# Patient Record
Sex: Male | Born: 1985
Health system: Southern US, Community
[De-identification: ages and names within clinical notes are randomized; demographics above are authoritative.]

---
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
## 2008-12-10 ENCOUNTER — Ambulatory Visit (HOSPITAL_COMMUNITY): Admission: EM | Admit: 2008-12-10 | Discharge: 2008-12-11 | Payer: Self-pay | Admitting: Emergency Medicine
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
## 2010-11-28 LAB — BASIC METABOLIC PANEL
CO2: 22 mEq/L (ref 19–32)
Calcium: 8.7 mg/dL (ref 8.4–10.5)
Chloride: 103 mEq/L (ref 96–112)
Creatinine, Ser: 1.24 mg/dL (ref 0.4–1.5)
GFR calc Af Amer: >60 mL/min (ref >60)
Glucose, Bld: 121 mg/dL — ABNORMAL HIGH (ref 70–99)
Sodium: 136 mEq/L (ref 135–145)

## 2010-11-28 LAB — CBC
Hemoglobin: 14.9 g/dL (ref 13.0–17.0)
MCHC: 34.7 g/dL (ref 30.0–36.0)
MCV: 89.7 fL (ref 78.0–100.0)
RBC: 4.8 MIL/uL (ref 4.22–5.81)
RDW: 13.1 % (ref 11.5–15.5)

## 2010-11-28 LAB — URINALYSIS, ROUTINE W REFLEX MICROSCOPIC
Bilirubin Urine: NEGATIVE
Glucose, UA: NEGATIVE mg/dL
Hgb urine dipstick: NEGATIVE
Protein, ur: NEGATIVE mg/dL
Urobilinogen, UA: 1 mg/dL (ref 0.0–1.0)

## 2010-11-28 LAB — DIFFERENTIAL
Basophils Absolute: 0 10*3/uL (ref 0.0–0.1)
Basophils Relative: 0 % (ref 0–1)
Eosinophils Absolute: 0.3 10*3/uL (ref 0.0–0.7)
Monocytes Absolute: 0.9 10*3/uL (ref 0.1–1.0)
Monocytes Relative: 8 % (ref 3–12)
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
## 2013-08-21 ENCOUNTER — Emergency Department (HOSPITAL_COMMUNITY)
Admission: EM | Admit: 2013-08-21 | Discharge: 2013-08-21 | Disposition: A | Discharge status: Home / Self Care | Payer: Self-pay | Attending: Emergency Medicine | Admitting: Emergency Medicine

## 2013-08-21 ENCOUNTER — Encounter (HOSPITAL_COMMUNITY): Payer: Self-pay | Admitting: Emergency Medicine

## 2013-08-21 DIAGNOSIS — K029 Dental caries, unspecified: Secondary | ICD-10-CM

## 2013-08-21 DIAGNOSIS — F17200 Nicotine dependence, unspecified, uncomplicated: Secondary | ICD-10-CM | POA: Insufficient documentation

## 2013-08-21 DIAGNOSIS — H9209 Otalgia, unspecified ear: Secondary | ICD-10-CM | POA: Insufficient documentation

## 2013-08-21 DIAGNOSIS — R51 Headache: Secondary | ICD-10-CM | POA: Insufficient documentation

## 2013-08-21 MED ORDER — PENICILLIN V POTASSIUM 500 MG PO TABS: 500.0000 mg | ORAL_TABLET | Freq: Four times a day (QID) | ORAL | Status: DC

## 2013-08-21 MED ORDER — TRAMADOL HCL 50 MG PO TABS: 50.0000 mg | ORAL_TABLET | Freq: Four times a day (QID) | ORAL | Status: DC | PRN

## 2013-08-21 NOTE — Discharge Instructions (Signed)
Dental Caries Dental caries is tooth decay. This decay can cause a hole in teeth (cavity) that can get bigger and deeper over time. HOME CARE  Brush and floss your teeth. Do this at least two times a day.  Use a fluoride toothpaste.  Use a mouth rinse if told by your dentist or doctor.  Eat less sugary and starchy foods. Drink less sugary drinks.  Avoid snacking often on sugary and starchy foods. Avoid sipping often on sugary drinks.  Keep regular checkups and cleanings with your dentist.  Use fluoride supplements if told by your dentist or doctor.  Allow fluoride to be applied to teeth if told by your dentist or doctor. MAKE SURE YOU:  Understand these instructions.  Will watch your condition.  Will get help right away if you are not doing well or get worse. Document Released: 05/14/2008 Document Revised: 04/07/2013 Document Reviewed: 08/07/2012 [facility] Patient Information 2014 [place].  Dental Pain Toothache is pain in or around a tooth. It may get worse with chewing or with cold or heat.  HOME CARE  Your dentist may use a numbing medicine during treatment. If so, you may need to avoid eating until the medicine wears off. Ask your dentist about this.  Only take medicine as told by your dentist or doctor.  Avoid chewing food near the painful tooth until after all treatment is done. Ask your dentist about this. GET HELP RIGHT AWAY IF:   The problem gets worse or new problems appear.  You have a fever.  There is redness and puffiness (swelling) of the face, jaw, or neck.  You cannot open your mouth.  There is pain in the jaw.  There is very bad pain that is not helped by medicine. MAKE SURE YOU:   Understand these instructions.  Will watch your condition.  Will get help right away if you are not doing well or get worse. Document Released: 01/22/2008 Document Revised: 10/28/2011 Document Reviewed: 01/22/2008 [facility] Patient Information 2014  [place], [place].

## 2013-08-21 NOTE — ED Provider Notes (Signed)
CSN: [id number]     Arrival date & time 08/21/13  1028 History  This chart was scribed for non-physician practitioner, Junius Finner, PA-C working with Dagmar Hait, MD by Greggory Stallion, ED scribe. This patient was seen in room TR06C/TR06C and the patient's care was started at 10:58 AM.   Chief Complaint  Patient presents with  . Dental Pain   The history is provided by the patient. No language interpreter was used.   HPI Comments: Samuel Marshall is a 28 y.o. male who presents to the Emergency Department complaining of gradual onset, constant, throbbing right lower dental pain that started about 2 weeks ago. Rates the pain 10/10. Pain radiates into his right ear and causes a headache. Pt has a dental appointment within the next few weeks. He has taken tylenol and ibuprofen with no relief. Denies fever, nausea, emesis, trouble breathing, trouble swallowing.  History reviewed. No pertinent past medical history. History reviewed. No pertinent past surgical history. History reviewed. No pertinent family history. History  Substance Use Topics  . Smoking status: Current Every Day Smoker  . Smokeless tobacco: Not on file  . Alcohol Use: Yes    Review of Systems  Constitutional: Negative for fever.  HENT: Positive for dental problem and ear pain. Negative for trouble swallowing.   Gastrointestinal: Negative for nausea and vomiting.  Neurological: Positive for headaches.  All other systems reviewed and are negative.    Allergies  Review of patient's allergies indicates no known allergies.  Home Medications   Current Outpatient Rx  Name  Route  Sig  Dispense  Refill  . acetaminophen (TYLENOL) 325 MG tablet   Oral   Take 650 mg by mouth every 6 (six) hours as needed for mild pain, moderate pain, fever or headache.         Marland Kitchen aspirin-acetaminophen-caffeine (EXCEDRIN MIGRAINE) 250-250-65 MG per tablet   Oral   Take 1-2 tablets by mouth every 6 (six) hours as needed for  headache.         . ibuprofen (ADVIL,MOTRIN) 200 MG tablet   Oral   Take 400 mg by mouth every 6 (six) hours as needed for fever, headache or mild pain.         . naproxen sodium (ANAPROX) 220 MG tablet   Oral   Take 220 mg by mouth 2 (two) times daily as needed (pain).         Marland Kitchen penicillin v potassium (VEETID) 500 MG tablet   Oral   Take 1 tablet (500 mg total) by mouth 4 (four) times daily.   40 tablet   0   . traMADol (ULTRAM) 50 MG tablet   Oral   Take 1 tablet (50 mg total) by mouth every 6 (six) hours as needed.   15 tablet   0     BP 144/97  Pulse 87  Temp(Src) 98.3 F (36.8 C)  Resp 18  Ht 6\' 3"  (1.905 m)  Wt 196 lb (88.905 kg)  BMI 24.50 kg/m2  SpO2 98%  Physical Exam  Nursing note and vitals reviewed. Constitutional: He appears well-developed and well-nourished.  HENT:  Head: Normocephalic and atraumatic.  Right Ear: Hearing, tympanic membrane, external ear and ear canal normal.  Left Ear: Hearing, tympanic membrane, external ear and ear canal normal.  Nose: Nose normal.  Mouth/Throat: Uvula is midline, oropharynx is clear and moist and mucous membranes are normal.    Severe dental decay in 12 year molars on left and right lower jaw.  Scant yellow discharge. No edema.  Oropharynx clear, no erythema, edema, or exudates.   Eyes: Conjunctivae are normal. No scleral icterus.  Neck: Normal range of motion.  Cardiovascular: Normal rate.   Pulmonary/Chest: Effort normal. No respiratory distress.  Musculoskeletal: Normal range of motion.  Neurological: He is alert.  Skin: Skin is warm and dry.    ED Course  Procedures (including critical care time)  DIAGNOSTIC STUDIES: Oxygen Saturation is 98% on RA, normal by my interpretation.    COORDINATION OF CARE: 11:00 AM-Discussed treatment plan which includes an antibiotic: PCN and pain medication: tramadol with pt at bedside and pt agreed to plan.  Advised to f/u with Dr. Leanord AsalFarless, call to make  appointment in 24-48hrs. Return precautions provided. Pt verbalized understanding and agreement with tx plan.  Labs Review Labs Reviewed - No data to display Imaging Review No results found.  EKG Interpretation   None       MDM   1. Dental decay   2. Pain due to dental caries     I personally performed the services described in this documentation, which was scribed in my presence. The recorded information has been reviewed and is accurate.   Junius Finnerrin O'Malley, PA-C 08/21/13 1558

## 2013-08-21 NOTE — ED Notes (Signed)
Discharge and follow up instructions reviewed with pt. Pt verbalized understanding.  

## 2013-08-21 NOTE — ED Provider Notes (Signed)
Medical screening examination/treatment/procedure(s) were performed by non-physician practitioner and as supervising physician I was immediately available for consultation/collaboration.  EKG Interpretation   None         Dagmar HaitWilliam Bill Mcvey, MD 08/21/13 [phone]

## 2013-08-21 NOTE — ED Notes (Signed)
Per pt possible infected tooth on the right bottom. sts making whole mouth and ear hurt. sts dental appt on the 20th.

## 2014-12-23 ENCOUNTER — Emergency Department (INDEPENDENT_AMBULATORY_CARE_PROVIDER_SITE_OTHER)
Admission: EM | Admit: 2014-12-23 | Discharge: 2014-12-23 | Disposition: A | Discharge status: Home / Self Care | Payer: Self-pay | Source: Home / Self Care

## 2014-12-23 ENCOUNTER — Encounter (HOSPITAL_COMMUNITY): Payer: Self-pay | Admitting: *Deleted

## 2014-12-23 MED ORDER — DICLOFENAC POTASSIUM 50 MG PO TABS: 50.0000 mg | ORAL_TABLET | Freq: Three times a day (TID) | ORAL | Status: DC

## 2014-12-23 MED ORDER — CYCLOBENZAPRINE HCL 5 MG PO TABS: 5.0000 mg | ORAL_TABLET | Freq: Three times a day (TID) | ORAL | Status: DC | PRN
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
## 2016-12-08 ENCOUNTER — Encounter (HOSPITAL_COMMUNITY): Payer: Self-pay | Admitting: Emergency Medicine

## 2016-12-08 ENCOUNTER — Emergency Department (HOSPITAL_COMMUNITY)
Admission: EM | Admit: 2016-12-08 | Discharge: 2016-12-08 | Disposition: A | Discharge status: Home / Self Care | Payer: Self-pay | Attending: Dermatology | Admitting: Dermatology

## 2016-12-08 DIAGNOSIS — Z5321 Procedure and treatment not carried out due to patient leaving prior to being seen by health care provider: Secondary | ICD-10-CM | POA: Insufficient documentation

## 2016-12-08 DIAGNOSIS — Y999 Unspecified external cause status: Secondary | ICD-10-CM | POA: Insufficient documentation

## 2016-12-08 DIAGNOSIS — S0191XA Laceration without foreign body of unspecified part of head, initial encounter: Secondary | ICD-10-CM | POA: Insufficient documentation

## 2016-12-08 DIAGNOSIS — W25XXXA Contact with sharp glass, initial encounter: Secondary | ICD-10-CM | POA: Insufficient documentation

## 2016-12-08 DIAGNOSIS — Y929 Unspecified place or not applicable: Secondary | ICD-10-CM | POA: Insufficient documentation

## 2016-12-08 DIAGNOSIS — Y9389 Activity, other specified: Secondary | ICD-10-CM | POA: Insufficient documentation
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
## 2016-12-08 NOTE — ED Triage Notes (Signed)
Pt denies any LOC at time of injuiry

## 2016-12-08 NOTE — ED Notes (Signed)
Pt reports that he would come back later due to wait.

## 2016-12-18 ENCOUNTER — Ambulatory Visit (HOSPITAL_COMMUNITY)
Admission: EM | Admit: 2016-12-18 | Discharge: 2016-12-18 | Disposition: A | Discharge status: Home / Self Care | Payer: Self-pay | Attending: Emergency Medicine | Admitting: Emergency Medicine

## 2016-12-18 ENCOUNTER — Encounter (HOSPITAL_COMMUNITY): Payer: Self-pay | Admitting: Emergency Medicine

## 2016-12-18 DIAGNOSIS — M542 Cervicalgia: Secondary | ICD-10-CM

## 2016-12-18 DIAGNOSIS — M5489 Other dorsalgia: Secondary | ICD-10-CM

## 2016-12-18 DIAGNOSIS — M7918 Myalgia, other site: Secondary | ICD-10-CM

## 2016-12-18 DIAGNOSIS — M25531 Pain in right wrist: Secondary | ICD-10-CM

## 2016-12-18 MED ORDER — DICLOFENAC SODIUM 75 MG PO TBEC: 75.0000 mg | DELAYED_RELEASE_TABLET | Freq: Two times a day (BID) | ORAL | 0 refills | Status: AC

## 2016-12-18 MED ORDER — CYCLOBENZAPRINE HCL 10 MG PO TABS: 10.0000 mg | ORAL_TABLET | Freq: Two times a day (BID) | ORAL | 0 refills | Status: AC | PRN
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
## 2021-06-27 ENCOUNTER — Ambulatory Visit: Payer: Self-pay
